# Patient Record
Sex: Male | Born: 2005 | Race: White | Hispanic: No | Marital: Single
Health system: Southern US, Community
[De-identification: ages and names within clinical notes are randomized; demographics above are authoritative.]

---
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
## 2024-01-01 ENCOUNTER — Ambulatory Visit (HOSPITAL_COMMUNITY): Payer: Federal, State, Local not specified - PPO | Admitting: Occupational Therapy

## 2024-01-08 ENCOUNTER — Ambulatory Visit (HOSPITAL_COMMUNITY): Payer: Federal, State, Local not specified - PPO | Admitting: Occupational Therapy

## 2024-01-15 ENCOUNTER — Ambulatory Visit (HOSPITAL_COMMUNITY): Payer: Federal, State, Local not specified - PPO | Admitting: Occupational Therapy

## 2024-01-22 ENCOUNTER — Ambulatory Visit (HOSPITAL_COMMUNITY): Payer: Federal, State, Local not specified - PPO | Admitting: Occupational Therapy

## 2024-01-29 ENCOUNTER — Ambulatory Visit (HOSPITAL_COMMUNITY): Payer: Federal, State, Local not specified - PPO | Admitting: Occupational Therapy

## 2024-02-05 ENCOUNTER — Ambulatory Visit (HOSPITAL_COMMUNITY): Payer: Self-pay | Admitting: Occupational Therapy

## 2024-02-12 ENCOUNTER — Ambulatory Visit (HOSPITAL_COMMUNITY): Payer: Federal, State, Local not specified - PPO | Admitting: Occupational Therapy

## 2024-02-19 ENCOUNTER — Ambulatory Visit (HOSPITAL_COMMUNITY): Payer: Self-pay | Admitting: Occupational Therapy

## 2024-02-26 ENCOUNTER — Ambulatory Visit (HOSPITAL_COMMUNITY): Payer: Federal, State, Local not specified - PPO | Admitting: Occupational Therapy

## 2024-03-04 ENCOUNTER — Ambulatory Visit (HOSPITAL_COMMUNITY): Payer: Federal, State, Local not specified - PPO | Admitting: Occupational Therapy

## 2024-03-11 ENCOUNTER — Ambulatory Visit (HOSPITAL_COMMUNITY): Payer: Self-pay | Admitting: Occupational Therapy

## 2024-03-18 ENCOUNTER — Ambulatory Visit (HOSPITAL_COMMUNITY): Payer: Federal, State, Local not specified - PPO | Admitting: Occupational Therapy

## 2024-03-25 ENCOUNTER — Ambulatory Visit (HOSPITAL_COMMUNITY): Payer: Self-pay | Admitting: Occupational Therapy

## 2024-04-01 ENCOUNTER — Ambulatory Visit (HOSPITAL_COMMUNITY): Payer: Self-pay | Admitting: Occupational Therapy

## 2024-04-08 ENCOUNTER — Ambulatory Visit (HOSPITAL_COMMUNITY): Payer: Federal, State, Local not specified - PPO | Admitting: Occupational Therapy

## 2024-04-15 ENCOUNTER — Ambulatory Visit (HOSPITAL_COMMUNITY): Payer: Self-pay | Admitting: Occupational Therapy

## 2024-04-22 ENCOUNTER — Ambulatory Visit (HOSPITAL_COMMUNITY): Payer: Self-pay | Admitting: Occupational Therapy

## 2024-04-29 ENCOUNTER — Ambulatory Visit (HOSPITAL_COMMUNITY): Payer: Self-pay | Admitting: Occupational Therapy

## 2024-05-06 ENCOUNTER — Ambulatory Visit (HOSPITAL_COMMUNITY): Payer: Federal, State, Local not specified - PPO | Admitting: Occupational Therapy

## 2024-05-13 ENCOUNTER — Ambulatory Visit (HOSPITAL_COMMUNITY): Payer: Federal, State, Local not specified - PPO | Admitting: Occupational Therapy

## 2024-05-20 ENCOUNTER — Ambulatory Visit (HOSPITAL_COMMUNITY): Payer: Federal, State, Local not specified - PPO | Admitting: Occupational Therapy
